# Patient Record
Sex: Female | Born: 1950 | Race: White | Hispanic: No | Marital: Married | Smoking: Never smoker
Health system: Southern US, Community
[De-identification: ages and names within clinical notes are randomized; demographics above are authoritative.]

---

## 2015-10-04 ENCOUNTER — Emergency Department
Admission: EM | Admit: 2015-10-04 | Discharge: 2015-10-04 | Disposition: A | Discharge status: Home / Self Care | Payer: PPO | Attending: Emergency Medicine | Admitting: Emergency Medicine

## 2015-10-04 DIAGNOSIS — I10 Essential (primary) hypertension: Secondary | ICD-10-CM | POA: Diagnosis not present

## 2015-10-04 DIAGNOSIS — Z7982 Long term (current) use of aspirin: Secondary | ICD-10-CM | POA: Insufficient documentation

## 2015-10-04 LAB — CBC
HCT: 44.5 % (ref 35.0–47.0)
HEMOGLOBIN: 15.1 g/dL (ref 12.0–16.0)
MCH: 29.4 pg (ref 26.0–34.0)
MCHC: 33.9 g/dL (ref 32.0–36.0)
MCV: 86.8 fL (ref 80.0–100.0)
Platelets: 329 10*3/uL (ref 150–440)
RBC: 5.12 MIL/uL (ref 3.80–5.20)
RDW: 12.4 % (ref 11.5–14.5)
WBC: 7.7 10*3/uL (ref 3.6–11.0)

## 2015-10-04 LAB — COMPREHENSIVE METABOLIC PANEL
ALK PHOS: 90 U/L (ref 38–126)
ALT: 31 U/L (ref 14–54)
ANION GAP: 7 (ref 5–15)
AST: 28 U/L (ref 15–41)
Albumin: 4.9 g/dL (ref 3.5–5.0)
BUN: 10 mg/dL (ref 6–20)
CALCIUM: 9.5 mg/dL (ref 8.9–10.3)
CO2: 28 mmol/L (ref 22–32)
Chloride: 107 mmol/L (ref 101–111)
Creatinine, Ser: 0.68 mg/dL (ref 0.44–1.00)
Glucose, Bld: 120 mg/dL — ABNORMAL HIGH (ref 65–99)
Potassium: 4.3 mmol/L (ref 3.5–5.1)
SODIUM: 142 mmol/L (ref 135–145)
Total Bilirubin: 0.8 mg/dL (ref 0.3–1.2)
Total Protein: 8 g/dL (ref 6.5–8.1)

## 2015-10-04 LAB — TROPONIN I

## 2015-10-04 MED ORDER — HYDROCHLOROTHIAZIDE 25 MG PO TABS
25.0000 mg | ORAL_TABLET | Freq: Every day | ORAL | Status: DC
  Administered 2015-10-04: 25 mg via ORAL
  Filled 2015-10-04: qty 1

## 2015-10-04 MED ORDER — HYDROCHLOROTHIAZIDE 25 MG PO TABS: 25.0000 mg | ORAL_TABLET | Freq: Every day | ORAL | Status: AC

## 2015-10-04 NOTE — ED Notes (Signed)
MD at bedside. 

## 2015-10-04 NOTE — Discharge Instructions (Signed)
As we discussed, though you do have high blood pressure (hypertension), fortunately it is not immediately dangerous at this time and does not need emergency intervention or admission to the hospital.  We have prescribed a good starting medication called HCTZ.  Please take it once a day as instructed.  Remember that it can lower your potassium so it is important to eat potassium rich foods and follow up with a primary care doctor for reevaluation at the next available opportunity.  Please follow up in clinic as recommended in these papers.    Return to the Emergency Department (ED) if you experience any worsening chest pain/pressure/tightness, difficulty breathing, or sudden sweating, or other symptoms that concern you.   Hypertension Hypertension, commonly called high blood pressure, is when the force of blood pumping through your arteries is too strong. Your arteries are the blood vessels that carry blood from your heart throughout your body. A blood pressure reading consists of a higher number over a lower number, such as 110/72. The higher number (systolic) is the pressure inside your arteries when your heart pumps. The lower number (diastolic) is the pressure inside your arteries when your heart relaxes. Ideally you want your blood pressure below 120/80. Hypertension forces your heart to work harder to pump blood. Your arteries may become narrow or stiff. Having hypertension puts you at risk for heart disease, stroke, and other problems.  RISK FACTORS Some risk factors for high blood pressure are controllable. Others are not.  Risk factors you cannot control include:   Race. You may be at higher risk if you are African American.  Age. Risk increases with age.  Gender. Men are at higher risk than women before age 75 years. After age 68, women are at higher risk than men. Risk factors you can control include:  Not getting enough exercise or physical activity.  Being overweight.  Getting too  much fat, sugar, calories, or salt in your diet.  Drinking too much alcohol. SIGNS AND SYMPTOMS Hypertension does not usually cause signs or symptoms. Extremely high blood pressure (hypertensive crisis) may cause headache, anxiety, shortness of breath, and nosebleed. DIAGNOSIS  To check if you have hypertension, your health care provider will measure your blood pressure while you are seated, with your arm held at the level of your heart. It should be measured at least twice using the same arm. Certain conditions can cause a difference in blood pressure between your right and left arms. A blood pressure reading that is higher than normal on one occasion does not mean that you need treatment. If one blood pressure reading is high, ask your health care provider about having it checked again. TREATMENT  Treating high blood pressure includes making lifestyle changes and possibly taking medicine. Living a healthy lifestyle can help lower high blood pressure. You may need to change some of your habits. Lifestyle changes may include:  Following the DASH diet. This diet is high in fruits, vegetables, and whole grains. It is low in salt, red meat, and added sugars.  Getting at least 2 hours of brisk physical activity every week.  Losing weight if necessary.  Not smoking.  Limiting alcoholic beverages.  Learning ways to reduce stress. If lifestyle changes are not enough to get your blood pressure under control, your health care provider may prescribe medicine. You may need to take more than one. Work closely with your health care provider to understand the risks and benefits. HOME CARE INSTRUCTIONS  Have your blood pressure rechecked  as directed by your health care provider.   Take medicines only as directed by your health care provider. Follow the directions carefully. Blood pressure medicines must be taken as prescribed. The medicine does not work as well when you skip doses. Skipping doses also  puts you at risk for problems.   Do not smoke.   Monitor your blood pressure at home as directed by your health care provider. SEEK MEDICAL CARE IF:   You think you are having a reaction to medicines taken.  You have recurrent headaches or feel dizzy.  You have swelling in your ankles.  You have trouble with your vision. SEEK IMMEDIATE MEDICAL CARE IF:  You develop a severe headache or confusion.  You have unusual weakness, numbness, or feel faint.  You have severe chest or abdominal pain.  You vomit repeatedly.  You have trouble breathing. MAKE SURE YOU:   Understand these instructions.  Will watch your condition.  Will get help right away if you are not doing well or get worse. Document Released: 09/23/2005 Document Revised: 02/07/2014 Document Reviewed: 07/16/2013 [facility] Patient Information 2015 [place], [place]. This information is not intended to replace advice given to you by your health care provider. Make sure you discuss any questions you have with your health care provider.  How to Take Your Blood Pressure HOW DO I GET A BLOOD PRESSURE MACHINE?  You can buy an electronic home blood pressure machine at your local pharmacy. Insurance will sometimes cover the cost if you have a prescription.  Ask your doctor what type of machine is best for you. There are different machines for your arm and your wrist.  If you decide to buy a machine to check your blood pressure on your arm, first check the size of your arm so you can buy the right size cuff. To check the size of your arm:   Use a measuring tape that shows both inches and centimeters.   Wrap the measuring tape around the upper-middle part of your arm. You may need someone to help you measure.   Write down your arm measurement in both inches and centimeters.   To measure your blood pressure correctly, it is important to have the right size cuff.   If your arm is up to 13 inches (up to 34  centimeters), get an adult cuff size.  If your arm is 13 to 17 inches (35 to 44 centimeters), get a large adult cuff size.    If your arm is 17 to 20 inches (45 to 52 centimeters), get an adult thigh cuff.  WHAT DO THE NUMBERS MEAN?   There are two numbers that make up your blood pressure. For example: 120/80.  The first number (120 in our example) is called the "systolic pressure." It is a measure of the pressure in your blood vessels when your heart is pumping blood.  The second number (80 in our example) is called the "diastolic pressure." It is a measure of the pressure in your blood vessels when your heart is resting between beats.  Your doctor will tell you what your blood pressure should be. WHAT SHOULD I DO BEFORE I CHECK MY BLOOD PRESSURE?   Try to rest or relax for at least 30 minutes before you check your blood pressure.  Do not smoke.  Do not have any drinks with caffeine, such as:  Soda.  Coffee.  Tea.  Check your blood pressure in a quiet room.  Sit down and stretch out your  arm on a table. Keep your arm at about the level of your heart. Let your arm relax.  Make sure that your legs are not crossed. HOW DO I CHECK MY BLOOD PRESSURE?  Follow the directions that came with your machine.  Make sure you remove any tight-fighting clothing from your arm or wrist. Wrap the cuff around your upper arm or wrist. You should be able to fit a finger between the cuff and your arm. If you cannot fit a finger between the cuff and your arm, it is too tight and should be removed and rewrapped.  Some units require you to manually pump up the arm cuff.  Automatic units inflate the cuff when you press a button.  Cuff deflation is automatic in both models.  After the cuff is inflated, the unit measures your blood pressure and pulse. The readings are shown on a monitor. Hold still and breathe normally while the cuff is inflated.  Getting a reading takes less than a  minute.  Some models store readings in a memory. Some provide a printout of readings. If your machine does not store your readings, keep a written record.  Take readings with you to your next visit with your doctor. Document Released: 09/05/2008 Document Revised: 02/07/2014 Document Reviewed: 11/18/2013 [facility] Patient Information 2015 [place]. This information is not intended to replace advice given to you by your health care provider. Make sure you discuss any questions you have with your health care provider.    DASH Eating Plan DASH stands for "Dietary Approaches to Stop Hypertension." The DASH eating plan is a healthy eating plan that has been shown to reduce high blood pressure (hypertension). Additional health benefits may include reducing the risk of type 2 diabetes mellitus, heart disease, and stroke. The DASH eating plan may also help with weight loss. WHAT DO I NEED TO KNOW ABOUT THE DASH EATING PLAN? For the DASH eating plan, you will follow these general guidelines:  Choose foods with a percent daily value for sodium of less than 5% (as listed on the food label).  Use salt-free seasonings or herbs instead of table salt or sea salt.  Check with your health care provider or pharmacist before using salt substitutes.  Eat lower-sodium products, often labeled as "lower sodium" or "no salt added."  Eat fresh foods.  Eat more vegetables, fruits, and low-fat dairy products.  Choose whole grains. Look for the word "whole" as the first word in the ingredient list.  Choose fish and skinless chicken or [place] more often than red meat. Limit fish, poultry, and meat to 6 oz (170 g) each day.  Limit sweets, desserts, sugars, and sugary drinks.  Choose heart-healthy fats.  Limit cheese to 1 oz (28 g) per day.  Eat more home-cooked food and less restaurant, buffet, and fast food.  Limit fried foods.  Cook foods using methods other than frying.  Limit canned vegetables.  If you do use them, rinse them well to decrease the sodium.  When eating at a restaurant, ask that your food be prepared with less salt, or no salt if possible. WHAT FOODS CAN I EAT? Seek help from a dietitian for individual calorie needs. Grains Whole grain or whole wheat bread. Brown rice. Whole grain or whole wheat pasta. Quinoa, bulgur, and whole grain cereals. Low-sodium cereals. Corn or whole wheat flour tortillas. Whole grain cornbread. Whole grain crackers. Low-sodium crackers. Vegetables Fresh or frozen vegetables (raw, steamed, roasted, or grilled). Low-sodium or reduced-sodium tomato and  vegetable juices. Low-sodium or reduced-sodium tomato sauce and paste. Low-sodium or reduced-sodium canned vegetables.  Fruits All fresh, canned (in natural juice), or frozen fruits. Meat and Other Protein Products Ground beef (85% or leaner), grass-fed beef, or beef trimmed of fat. Skinless chicken or [place]. Ground chicken or [place]. Pork trimmed of fat. All fish and seafood. Eggs. Dried beans, peas, or lentils. Unsalted nuts and seeds. Unsalted canned beans. Dairy Low-fat dairy products, such as skim or 1% milk, 2% or reduced-fat cheeses, low-fat ricotta or cottage cheese, or plain low-fat yogurt. Low-sodium or reduced-sodium cheeses. Fats and Oils Tub margarines without trans fats. Light or reduced-fat mayonnaise and salad dressings (reduced sodium). Avocado. Safflower, olive, or canola oils. Natural peanut or almond butter. Other Unsalted popcorn and pretzels. The items listed above may not be a complete list of recommended foods or beverages. Contact your dietitian for more options. WHAT FOODS ARE NOT RECOMMENDED? Grains White bread. White pasta. White rice. Refined cornbread. Bagels and croissants. Crackers that contain trans fat. Vegetables Creamed or fried vegetables. Vegetables in a cheese sauce. Regular canned vegetables. Regular canned tomato sauce and paste. Regular tomato and  vegetable juices. Fruits Dried fruits. Canned fruit in light or heavy syrup. Fruit juice. Meat and Other Protein Products Fatty cuts of meat. Ribs, chicken wings, bacon, sausage, bologna, salami, chitterlings, fatback, hot dogs, bratwurst, and packaged luncheon meats. Salted nuts and seeds. Canned beans with salt. Dairy Whole or 2% milk, cream, half-and-half, and cream cheese. Whole-fat or sweetened yogurt. Full-fat cheeses or blue cheese. Nondairy creamers and whipped toppings. Processed cheese, cheese spreads, or cheese curds. Condiments Onion and garlic salt, seasoned salt, table salt, and sea salt. Canned and packaged gravies. Worcestershire sauce. Tartar sauce. Barbecue sauce. Teriyaki sauce. Soy sauce, including reduced sodium. Steak sauce. Fish sauce. Oyster sauce. Cocktail sauce. Horseradish. Ketchup and mustard. Meat flavorings and tenderizers. Bouillon cubes. Hot sauce. Tabasco sauce. Marinades. Taco seasonings. Relishes. Fats and Oils Butter, stick margarine, lard, shortening, ghee, and bacon fat. Coconut, palm kernel, or palm oils. Regular salad dressings. Other Pickles and olives. Salted popcorn and pretzels. The items listed above may not be a complete list of foods and beverages to avoid. Contact your dietitian for more information. WHERE CAN I FIND MORE INFORMATION? National Heart, Lung, and Blood Institute: [employer]   This information is not intended to replace advice given to you by your health care provider. Make sure you discuss any questions you have with your health care provider.   Document Released: 09/12/2011 Document Revised: 10/14/2014 Document Reviewed: 07/28/2013 Elsevier Interactive Patient Education 2016 [employer].   How to Take Your Blood Pressure HOW DO I GET A BLOOD PRESSURE MACHINE?  You can buy an electronic home blood pressure machine at your local pharmacy. Insurance will sometimes cover the cost if you  have a prescription.  Ask your doctor what type of machine is best for you. There are different machines for your arm and your wrist.  If you decide to buy a machine to check your blood pressure on your arm, first check the size of your arm so you can buy the right size cuff. To check the size of your arm:   Use a measuring tape that shows both inches and centimeters.   Wrap the measuring tape around the upper-middle part of your arm. You may need someone to help you measure.   Write down your arm measurement in both inches and centimeters.   To measure your blood pressure correctly, it is  important to have the right size cuff.   If your arm is up to 13 inches (up to 34 centimeters), get an adult cuff size.  If your arm is 13 to 17 inches (35 to 44 centimeters), get a large adult cuff size.    If your arm is 17 to 20 inches (45 to 52 centimeters), get an adult thigh cuff.  WHAT DO THE NUMBERS MEAN?   There are two numbers that make up your blood pressure. For example: 120/80.  The first number (120 in our example) is called the "systolic pressure." It is a measure of the pressure in your blood vessels when your heart is pumping blood.  The second number (80 in our example) is called the "diastolic pressure." It is a measure of the pressure in your blood vessels when your heart is resting between beats.  Your doctor will tell you what your blood pressure should be. WHAT SHOULD I DO BEFORE I CHECK MY BLOOD PRESSURE?   Try to rest or relax for at least 30 minutes before you check your blood pressure.  Do not smoke.  Do not have any drinks with caffeine, such as:  Soda.  Coffee.  Tea.  Check your blood pressure in a quiet room.  Sit down and stretch out your arm on a table. Keep your arm at about the level of your heart. Let your arm relax.  Make sure that your legs are not crossed. HOW DO I CHECK MY BLOOD PRESSURE?  Follow the directions that came with your  machine.  Make sure you remove any tight-fitting clothing from your arm or wrist. Wrap the cuff around your upper arm or wrist. You should be able to fit a finger between the cuff and your arm. If you cannot fit a finger between the cuff and your arm, it is too tight and should be removed and rewrapped.  Some units require you to manually pump up the arm cuff.  Automatic units inflate the cuff when you press a button.  Cuff deflation is automatic in both models.  After the cuff is inflated, the unit measures your blood pressure and pulse. The readings are shown on a monitor. Hold still and breathe normally while the cuff is inflated.  Getting a reading takes less than a minute.  Some models store readings in a memory. Some provide a printout of readings. If your machine does not store your readings, keep a written record.  Take readings with you to your next visit with your doctor.   This information is not intended to replace advice given to you by your health care provider. Make sure you discuss any questions you have with your health care provider.   Document Released: 09/05/2008 Document Revised: 10/14/2014 Document Reviewed: 11/18/2013 Elsevier Interactive Patient Education 2016 Elsevier Inc.  Managing Your High Blood Pressure Blood pressure is a measurement of how forceful your blood is pressing against the walls of the arteries. Arteries are muscular tubes within the circulatory system. Blood pressure does not stay the same. Blood pressure rises when you are active, excited, or nervous; and it lowers during sleep and relaxation. If the numbers measuring your blood pressure stay above normal most of the time, you are at risk for health problems. High blood pressure (hypertension) is a long-term (chronic) condition in which blood pressure is elevated. A blood pressure reading is recorded as two numbers, such as 120 over 80 (or 120/80). The first, higher number is called the systolic  pressure. It is a measure of the pressure in your arteries as the heart beats. The second, lower number is called the diastolic pressure. It is a measure of the pressure in your arteries as the heart relaxes between beats.  Keeping your blood pressure in a normal range is important to your overall health and prevention of health problems, such as heart disease and stroke. When your blood pressure is uncontrolled, your heart has to work harder than normal. High blood pressure is a very common condition in adults because blood pressure tends to rise with age. Men and women are equally likely to have hypertension but at different times in life. Before age 65, men are more likely to have hypertension. After 66 years of age, women are more likely to have it. Hypertension is especially common in African Americans. This condition often has no signs or symptoms. The cause of the condition is usually not known. Your caregiver can help you come up with a plan to keep your blood pressure in a normal, healthy range. BLOOD PRESSURE STAGES Blood pressure is classified into four stages: normal, prehypertension, stage 1, and stage 2. Your blood pressure reading will be used to determine what type of treatment, if any, is necessary. Appropriate treatment options are tied to these four stages:  Normal  Systolic pressure (mm Hg): below 120.  Diastolic pressure (mm Hg): below 80. Prehypertension  Systolic pressure (mm Hg): 120 to 139.  Diastolic pressure (mm Hg): 80 to 89. Stage1  Systolic pressure (mm Hg): 140 to 159.  Diastolic pressure (mm Hg): 90 to 99. Stage2  Systolic pressure (mm Hg): 160 or above.  Diastolic pressure (mm Hg): 100 or above. RISKS RELATED TO HIGH BLOOD PRESSURE Managing your blood pressure is an important responsibility. Uncontrolled high blood pressure can lead to:  A heart attack.  A stroke.  A weakened blood vessel (aneurysm).  Heart failure.  Kidney damage.  Eye  damage.  Metabolic syndrome.  Memory and concentration problems. HOW TO MANAGE YOUR BLOOD PRESSURE Blood pressure can be managed effectively with lifestyle changes and medicines (if needed). Your caregiver will help you come up with a plan to bring your blood pressure within a normal range. Your plan should include the following: Education  Read all information provided by your caregivers about how to control blood pressure.  Educate yourself on the latest guidelines and treatment recommendations. New research is always being done to further define the risks and treatments for high blood pressure. Lifestylechanges  Control your weight.  Avoid smoking.  Stay physically active.  Reduce the amount of salt in your diet.  Reduce stress.  Control any chronic conditions, such as high cholesterol or diabetes.  Reduce your alcohol intake. Medicines  Several medicines (antihypertensive medicines) are available, if needed, to bring blood pressure within a normal range. Communication  Review all the medicines you take with your caregiver because there may be side effects or interactions.  Talk with your caregiver about your diet, exercise habits, and other lifestyle factors that may be contributing to high blood pressure.  See your caregiver regularly. Your caregiver can help you create and adjust your plan for managing high blood pressure. RECOMMENDATIONS FOR TREATMENT AND FOLLOW-UP  The following recommendations are based on current guidelines for managing high blood pressure in nonpregnant adults. Use these recommendations to identify the proper follow-up period or treatment option based on your blood pressure reading. You can discuss these options with your caregiver.  Systolic pressure of 120 to  139 or diastolic pressure of 80 to 89: Follow up with your caregiver as directed.  Systolic pressure of 140 to 160 or diastolic pressure of 90 to 100: Follow up with your caregiver within  2 months.  Systolic pressure above 160 or diastolic pressure above 100: Follow up with your caregiver within 1 month.  Systolic pressure above 180 or diastolic pressure above 110: Consider antihypertensive therapy; follow up with your caregiver within 1 week.  Systolic pressure above 200 or diastolic pressure above 120: Begin antihypertensive therapy; follow up with your caregiver within 1 week.   This information is not intended to replace advice given to you by your health care provider. Make sure you discuss any questions you have with your health care provider.   Document Released: 06/17/2012 Document Reviewed: 06/17/2012 Elsevier Interactive Patient Education 2016 Elsevier Inc.   Potassium Content of Foods  Potassium is a mineral found in many foods and drinks. It helps keep fluids and minerals balanced in your body and affects how steadily your heart beats. Potassium also helps control your blood pressure and keep your muscles and nervous system healthy.  Certain health conditions and medicines may change the balance of potassium in your body. When this happens, you can help balance your level of potassium through the foods that you do or do not eat. Your health care provider or dietitian may recommend an amount of potassium that you should have each day. The following lists of foods provide the amount of potassium (in parentheses) per serving in each item.  HIGH IN POTASSIUM  The following foods and beverages have 200 mg or more of potassium per serving:  Apricots, 2 raw or 5 dry (200 mg).  Artichoke, 1 medium (345 mg).  Avocado, raw,  each (245 mg).  Banana, 1 medium (425 mg).  Beans, lima, or baked beans, canned,  cup (280 mg).  Beans, white, canned,  cup (595 mg).  Beef roast, 3 oz (320 mg).  Beef, ground, 3 oz (270 mg).  Beets, raw or cooked,  cup (260 mg).  Bran muffin, 2 oz (300 mg).  Broccoli,  cup (230 mg).  Brussels sprouts,  cup (250 mg).  Cantaloupe,  cup (215  mg).  Cereal, 100% bran,  cup (200-400 mg).  Cheeseburger, single, fast food, 1 each (225-400 mg).  Chicken, 3 oz (220 mg).  Clams, canned, 3 oz (535 mg).  Crab, 3 oz (225 mg).  Dates, 5 each (270 mg).  Dried beans and peas,  cup (300-475 mg).  Figs, dried, 2 each (260 mg).  Fish: halibut, tuna, cod, snapper, 3 oz (480 mg).  Fish: salmon, haddock, swordfish, perch, 3 oz (300 mg).  Fish, tuna, canned 3 oz (200 mg).  [place] fries, fast food, 3 oz (470 mg).  Granola with fruit and nuts,  cup (200 mg).  Grapefruit juice,  cup (200 mg).  Greens, beet,  cup (655 mg).  Honeydew melon,  cup (200 mg).  Kale, raw, 1 cup (300 mg).  Kiwi, 1 medium (240 mg).  Kohlrabi, rutabaga, parsnips,  cup (280 mg).  Lentils,  cup (365 mg).  Mango, 1 each (325 mg).  Milk, chocolate, 1 cup (420 mg).  Milk: nonfat, low-fat, whole, buttermilk, 1 cup (350-380 mg).  Molasses, 1 Tbsp (295 mg).  Mushrooms,  cup (280) mg.  Nectarine, 1 each (275 mg).  Nuts: almonds, peanuts, hazelnuts, [place], cashew, mixed, 1 oz (200 mg).  Nuts, pistachios, 1 oz (295 mg).  Orange, 1 each (240 mg).  Orange juice,  cup (235 mg).  Papaya, medium,  fruit (390 mg).  Peanut butter, chunky, 2 Tbsp (240 mg).  Peanut butter, smooth, 2 Tbsp (210 mg).  Pear, 1 medium (200 mg).  Pomegranate, 1 whole (400 mg).  Pomegranate juice,  cup (215 mg).  Pork, 3 oz (350 mg).  Potato chips, salted, 1 oz (465 mg).  Potato, baked with skin, 1 medium (925 mg).  Potatoes, boiled,  cup (255 mg).  Potatoes, mashed,  cup (330 mg).  Prune juice,  cup (370 mg).  Prunes, 5 each (305 mg).  Pudding, chocolate,  cup (230 mg).  Pumpkin, canned,  cup (250 mg).  Raisins, seedless,  cup (270 mg).  Seeds, sunflower or pumpkin, 1 oz (240 mg).  Soy milk, 1 cup (300 mg).  Spinach,  cup (420 mg).  Spinach, canned,  cup (370 mg).  Sweet potato, baked with skin, 1 medium (450 mg).  Swiss chard,  cup (480 mg).  Tomato or vegetable juice,   cup (275 mg).  Tomato sauce or puree,  cup (400-550 mg).  Tomato, raw, 1 medium (290 mg).  Tomatoes, canned,  cup (200-300 mg).  [place], 3 oz (250 mg).  Wheat germ, 1 oz (250 mg).  Winter squash,  cup (250 mg).  Yogurt, plain or fruited, 6 oz (260-435 mg).  Zucchini,  cup (220 mg). MODERATE IN POTASSIUM  The following foods and beverages have 50-200 mg of potassium per serving:  Apple, 1 each (150 mg).  Apple juice,  cup (150 mg).  Applesauce,  cup (90 mg).  Apricot nectar,  cup (140 mg).  Asparagus, small spears,  cup or 6 spears (155 mg).  Bagel, cinnamon raisin, 1 each (130 mg).  Bagel, egg or plain, 4 in., 1 each (70 mg).  Beans, green,  cup (90 mg).  Beans, yellow,  cup (190 mg).  Beer, regular, 12 oz (100 mg).  Beets, canned,  cup (125 mg).  Blackberries,  cup (115 mg).  Blueberries,  cup (60 mg).  Bread, whole wheat, 1 slice (70 mg).  Broccoli, raw,  cup (145 mg).  Cabbage,  cup (150 mg).  Carrots, cooked or raw,  cup (180 mg).  Cauliflower, raw,  cup (150 mg).  Celery, raw,  cup (155 mg).  Cereal, bran flakes, cup (120-150 mg).  Cheese, cottage,  cup (110 mg).  Cherries, 10 each (150 mg).  Chocolate, 1 oz bar (165 mg).  Coffee, brewed 6 oz (90 mg).  Corn,  cup or 1 ear (195 mg).  Cucumbers,  cup (80 mg).  Egg, large, 1 each (60 mg).  Eggplant,  cup (60 mg).  Endive, raw, cup (80 mg).  English muffin, 1 each (65 mg).  Fish, orange roughy, 3 oz (150 mg).  Frankfurter, beef or pork, 1 each (75 mg).  Fruit cocktail,  cup (115 mg).  Grape juice,  cup (170 mg).  Grapefruit,  fruit (175 mg).  Grapes,  cup (155 mg).  Greens: kale, turnip, collard,  cup (110-150 mg).  Ice cream or frozen yogurt, chocolate,  cup (175 mg).  Ice cream or frozen yogurt, vanilla,  cup (120-150 mg).  Lemons, limes, 1 each (80 mg).  Lettuce, all types, 1 cup (100 mg).  Mixed vegetables,  cup (150 mg).  Mushrooms, raw,  cup (110 mg).  Nuts: walnuts,  pecans, or macadamia, 1 oz (125 mg).  Oatmeal,  cup (80 mg).  Okra,  cup (110 mg).  Onions, raw,  cup (120 mg).  Peach, 1  each (185 mg).  Peaches, canned,  cup (120 mg).  Pears, canned,  cup (120 mg).  Peas, green, frozen,  cup (90 mg).  Peppers, green,  cup (130 mg).  Peppers, red,  cup (160 mg).  Pineapple juice,  cup (165 mg).  Pineapple, fresh or canned,  cup (100 mg).  Plums, 1 each (105 mg).  Pudding, vanilla,  cup (150 mg).  Raspberries,  cup (90 mg).  Rhubarb,  cup (115 mg).  Rice, wild,  cup (80 mg).  Shrimp, 3 oz (155 mg).  Spinach, raw, 1 cup (170 mg).  Strawberries,  cup (125 mg).  Summer squash  cup (175-200 mg).  Swiss chard, raw, 1 cup (135 mg).  Tangerines, 1 each (140 mg).  Tea, brewed, 6 oz (65 mg).  Turnips,  cup (140 mg).  Watermelon,  cup (85 mg).  Wine, red, table, 5 oz (180 mg).  Wine, white, table, 5 oz (100 mg). LOW IN POTASSIUM  The following foods and beverages have less than 50 mg of potassium per serving.  Bread, white, 1 slice (30 mg).  Carbonated beverages, 12 oz (less than 5 mg).  Cheese, 1 oz (20-30 mg).  Cranberries,  cup (45 mg).  Cranberry juice cocktail,  cup (20 mg).  Fats and oils, 1 Tbsp (less than 5 mg).  Hummus, 1 Tbsp (32 mg).  Nectar: papaya, mango, or pear,  cup (35 mg).  Rice, white or brown,  cup (50 mg).  Spaghetti or macaroni,  cup cooked (30 mg).  Tortilla, flour or corn, 1 each (50 mg).  Waffle, 4 in., 1 each (50 mg).  Water chestnuts,  cup (40 mg). This information is not intended to replace advice given to you by your health care provider. Make sure you discuss any questions you have with your health care provider.  Document Released: 05/07/2005 Document Revised: 09/28/2013 Document Reviewed: 08/20/2013  Elsevier Interactive Patient Education [employer]

## 2015-10-04 NOTE — ED Provider Notes (Signed)
[facility] Emergency Department Provider Note  ____________________________________________  Time seen: Approximately 4:19 PM  I have reviewed the triage vital signs and the nursing notes.   HISTORY  Chief Complaint Hypertension    HPI Samantha Baldwin is a 64 y.o. female with no past medical history and who takes no medications and who has no primary care doctor who presents with asymptomatic hypertension after having her blood pressure taken at a health fair.  She has not had a physical exam or a doctor checkup and more than 2 years.  She reports that she currently takes no medications daily and she feels fine. The only reason she came in is that she went to a health fair today and had her blood pressure checked. She reports that her blood pressure was in the 180s over 100 range, and she was told that she should go immediately to the hospital and that if she starts to feel ill they should pull over and call EMS. As a result she is quite nervous and concerned but remains asymptomatic. Specifically she denies any history of chest pain, shortness of breath, nausea, vomiting, diarrhea, headache, visual changes, or urinary changes.   History reviewed. No pertinent past medical history.  There are no active problems to display for this patient.   History reviewed. No pertinent past surgical history.  Current Outpatient Rx  Name  Route  Sig  Dispense  Refill  . aspirin EC 81 MG tablet   Oral   Take 81 mg by mouth every evening.         . hydrochlorothiazide (HYDRODIURIL) 25 MG tablet   Oral   Take 1 tablet (25 mg total) by mouth daily.   30 tablet   1     Allergies Review of patient's allergies indicates no known allergies.  No family history on file.  Social History Social History  Substance Use Topics  . Smoking status: Never Smoker   . Smokeless tobacco: None  . Alcohol Use: No    Review of Systems Constitutional: No fever/chills Eyes:  No visual changes. ENT: No sore throat. Cardiovascular: Denies chest pain. Respiratory: Denies shortness of breath. Gastrointestinal: No abdominal pain.  No nausea, no vomiting.  No diarrhea.  No constipation. Genitourinary: Negative for dysuria. Musculoskeletal: Negative for back pain. Skin: Negative for rash. Neurological: Negative for headaches, focal weakness or numbness.  10-point ROS otherwise negative.  ____________________________________________   PHYSICAL EXAM:  VITAL SIGNS: ED Triage Vitals  Enc Vitals Group     BP 10/04/15 1340 226/99 mmHg     Pulse Rate 10/04/15 1340 71     Resp 10/04/15 1340 20     Temp 10/04/15 1340 98.2 F (36.8 C)     Temp Source 10/04/15 1340 Oral     SpO2 10/04/15 1340 98 %     Weight 10/04/15 1340 163 lb (73.936 kg)     Height 10/04/15 1340 5\' 2"  (1.575 m)     Head Cir --      Peak Flow --      Pain Score --      Pain Loc --      Pain Edu? --      Excl. in GC? --     Constitutional: Alert and oriented. Well appearing and in no acute distress. Eyes: Conjunctivae are normal. PERRL. EOMI. no evidence of papilledema on funduscopic exam. Head: Atraumatic. Nose: No congestion/rhinnorhea. Mouth/Throat: Mucous membranes are moist.  Oropharynx non-erythematous. Neck: No stridor.  Cardiovascular: Normal rate, regular rhythm. Grossly normal heart sounds.  Good peripheral circulation. Respiratory: Normal respiratory effort.  No retractions. Lungs CTAB. Gastrointestinal: Soft and nontender. No distention. No abdominal bruits. No CVA tenderness. Musculoskeletal: No lower extremity tenderness nor edema.  No joint effusions. Neurologic:  Normal speech and language. No gross focal neurologic deficits are appreciated.  Skin:  Skin is warm, dry and intact. No rash noted. Psychiatric: Mood and affect are normal. Speech and behavior are normal.  ____________________________________________   LABS (all labs ordered are listed, but only abnormal  results are displayed)  Labs Reviewed  COMPREHENSIVE METABOLIC PANEL - Abnormal; Notable for the following:    Glucose, Bld 120 (*)    All other components within normal limits  CBC  TROPONIN I   ____________________________________________  EKG  ED ECG REPORT I, Isabella Ida, the attending physician, personally viewed and interpreted this ECG.   Date: 10/04/2015  EKG Time: 13:47  Rate: 68  Rhythm: normal sinus rhythm  Axis: Left axis deviation  Intervals:right bundle branch block  ST&T Change: Inverted T-wave in lead 3 and V2 through V4, nonspecific and not accompanied with any chest pain or shortness of breath.  ____________________________________________  RADIOLOGY   No results found.  ____________________________________________   PROCEDURES  Procedure(s) performed: None  Critical Care performed: No ____________________________________________   INITIAL IMPRESSION / ASSESSMENT AND PLAN / ED COURSE  Pertinent labs & imaging results that were available during my care of the patient were reviewed by me and considered in my medical decision making (see chart for details).  Though the patients blood pressure is consistently elevated throughout her stay in the emergency department, she remains asymptomatic. I gave her a dose of HCTZ 25 mg PO and it had minimal effect. However, the patient's blood pressure has certainly been elevated for an extended period of time, and it would be irresponsible and dangerous of me to attempt to quickly or dramatically lower her blood pressure, particularly with IV medication, given that she is completely asymptomatic with no evidence of end organ dysfunction in a reassuring workup.  I discussed this extensively with her and her husband. We all agreed that the best course of action is to start on a medication such as HCTZ 25 mg PO daily and for her to become established with the primary care doctor as soon as possible. My understanding  is that Dr. Aram Beecham is accepting new patients and I provided his name and phone number, but encouraged her to follow up with anyone whom she can see relatively quickly. I warned her that HCTZ can cause potassium depletion and encouraged her to eat plenty of testing rich foods as well as following up for repeat blood tests within the next week to two weeks to check her level. I gave my usual and customary return precautions. She and her husband understand and agree completely with the plan.  ____________________________________________  FINAL CLINICAL IMPRESSION(S) / ED DIAGNOSES  Final diagnoses:  Essential hypertension      NEW MEDICATIONS STARTED DURING THIS VISIT:  Discharge Medication List as of 10/04/2015  4:36 PM    START taking these medications   Details  hydrochlorothiazide (HYDRODIURIL) 25 MG tablet Take 1 tablet (25 mg total) by mouth daily., Starting 10/04/2015, Until Discontinued, Print         Loleta Rose, MD 10/05/15 [phone]

## 2015-10-04 NOTE — ED Notes (Signed)
MD notified of pt, no further order received.

## 2015-10-04 NOTE — ED Notes (Signed)
Pt arrives to ED via POV for hypertension. Pt went to health fair today and had BP read high at 186/108, no hx of hypertension. Pt alert and oriented X4, active, cooperative, pt in NAD. RR even and unlabored, color WNL.  Denies headache, weakness or any other symptoms. Pt states that she feels normal.

## 2015-10-11 DIAGNOSIS — Z1211 Encounter for screening for malignant neoplasm of colon: Secondary | ICD-10-CM | POA: Diagnosis not present

## 2015-10-11 DIAGNOSIS — Z1239 Encounter for other screening for malignant neoplasm of breast: Secondary | ICD-10-CM | POA: Diagnosis not present

## 2015-10-11 DIAGNOSIS — Z0000 Encounter for general adult medical examination without abnormal findings: Secondary | ICD-10-CM | POA: Diagnosis not present

## 2015-10-11 DIAGNOSIS — Z1329 Encounter for screening for other suspected endocrine disorder: Secondary | ICD-10-CM | POA: Diagnosis not present

## 2015-10-11 DIAGNOSIS — Z79899 Other long term (current) drug therapy: Secondary | ICD-10-CM | POA: Diagnosis not present

## 2015-10-11 DIAGNOSIS — Z13220 Encounter for screening for lipoid disorders: Secondary | ICD-10-CM | POA: Diagnosis not present

## 2015-10-11 DIAGNOSIS — I10 Essential (primary) hypertension: Secondary | ICD-10-CM | POA: Diagnosis not present

## 2015-10-12 ENCOUNTER — Other Ambulatory Visit: Payer: Self-pay | Admitting: Internal Medicine

## 2015-10-12 DIAGNOSIS — Z1231 Encounter for screening mammogram for malignant neoplasm of breast: Secondary | ICD-10-CM

## 2015-10-17 ENCOUNTER — Ambulatory Visit
Admission: RE | Admit: 2015-10-17 | Discharge: 2015-10-17 | Disposition: A | Discharge status: Home / Self Care | Payer: PPO | Source: Ambulatory Visit | Attending: Internal Medicine | Admitting: Internal Medicine

## 2015-10-17 DIAGNOSIS — Z1231 Encounter for screening mammogram for malignant neoplasm of breast: Secondary | ICD-10-CM | POA: Diagnosis not present

## 2015-10-20 DIAGNOSIS — I10 Essential (primary) hypertension: Secondary | ICD-10-CM | POA: Diagnosis not present

## 2015-11-15 DIAGNOSIS — Z13220 Encounter for screening for lipoid disorders: Secondary | ICD-10-CM | POA: Diagnosis not present

## 2015-11-15 DIAGNOSIS — Z79899 Other long term (current) drug therapy: Secondary | ICD-10-CM | POA: Diagnosis not present

## 2015-11-15 DIAGNOSIS — I10 Essential (primary) hypertension: Secondary | ICD-10-CM | POA: Diagnosis not present

## 2015-11-15 DIAGNOSIS — Z1329 Encounter for screening for other suspected endocrine disorder: Secondary | ICD-10-CM | POA: Diagnosis not present

## 2015-12-06 DIAGNOSIS — Z79899 Other long term (current) drug therapy: Secondary | ICD-10-CM | POA: Diagnosis not present

## 2015-12-06 DIAGNOSIS — Z1329 Encounter for screening for other suspected endocrine disorder: Secondary | ICD-10-CM | POA: Diagnosis not present

## 2015-12-06 DIAGNOSIS — I10 Essential (primary) hypertension: Secondary | ICD-10-CM | POA: Diagnosis not present

## 2015-12-06 DIAGNOSIS — Z13220 Encounter for screening for lipoid disorders: Secondary | ICD-10-CM | POA: Diagnosis not present

## 2015-12-13 DIAGNOSIS — R7309 Other abnormal glucose: Secondary | ICD-10-CM | POA: Diagnosis not present

## 2015-12-13 DIAGNOSIS — H6123 Impacted cerumen, bilateral: Secondary | ICD-10-CM | POA: Diagnosis not present

## 2015-12-13 DIAGNOSIS — I10 Essential (primary) hypertension: Secondary | ICD-10-CM | POA: Diagnosis not present

## 2015-12-29 DIAGNOSIS — H6123 Impacted cerumen, bilateral: Secondary | ICD-10-CM | POA: Diagnosis not present

## 2016-03-28 DIAGNOSIS — Z79899 Other long term (current) drug therapy: Secondary | ICD-10-CM | POA: Diagnosis not present

## 2016-03-28 DIAGNOSIS — I10 Essential (primary) hypertension: Secondary | ICD-10-CM | POA: Diagnosis not present

## 2016-03-28 DIAGNOSIS — R7309 Other abnormal glucose: Secondary | ICD-10-CM | POA: Diagnosis not present

## 2016-10-15 DIAGNOSIS — Z1231 Encounter for screening mammogram for malignant neoplasm of breast: Secondary | ICD-10-CM | POA: Diagnosis not present

## 2016-10-15 DIAGNOSIS — Z79899 Other long term (current) drug therapy: Secondary | ICD-10-CM | POA: Diagnosis not present

## 2016-10-15 DIAGNOSIS — I10 Essential (primary) hypertension: Secondary | ICD-10-CM | POA: Diagnosis not present

## 2016-10-15 DIAGNOSIS — Z0000 Encounter for general adult medical examination without abnormal findings: Secondary | ICD-10-CM | POA: Diagnosis not present

## 2016-10-15 DIAGNOSIS — R7309 Other abnormal glucose: Secondary | ICD-10-CM | POA: Diagnosis not present

## 2016-10-15 DIAGNOSIS — Z1211 Encounter for screening for malignant neoplasm of colon: Secondary | ICD-10-CM | POA: Diagnosis not present

## 2016-10-15 DIAGNOSIS — E7800 Pure hypercholesterolemia, unspecified: Secondary | ICD-10-CM | POA: Diagnosis not present

## 2016-10-29 ENCOUNTER — Other Ambulatory Visit: Payer: Self-pay | Admitting: Internal Medicine

## 2016-10-29 DIAGNOSIS — Z1231 Encounter for screening mammogram for malignant neoplasm of breast: Secondary | ICD-10-CM

## 2016-11-04 IMAGING — MG MM DIGITAL SCREENING BILAT W/ CAD
4 series · 4 of 4 positions shown · non-contrast
Comparison: None.

CLINICAL DATA: Screening.

EXAM:
DIGITAL SCREENING BILATERAL MAMMOGRAM WITH CAD

[R MLO]
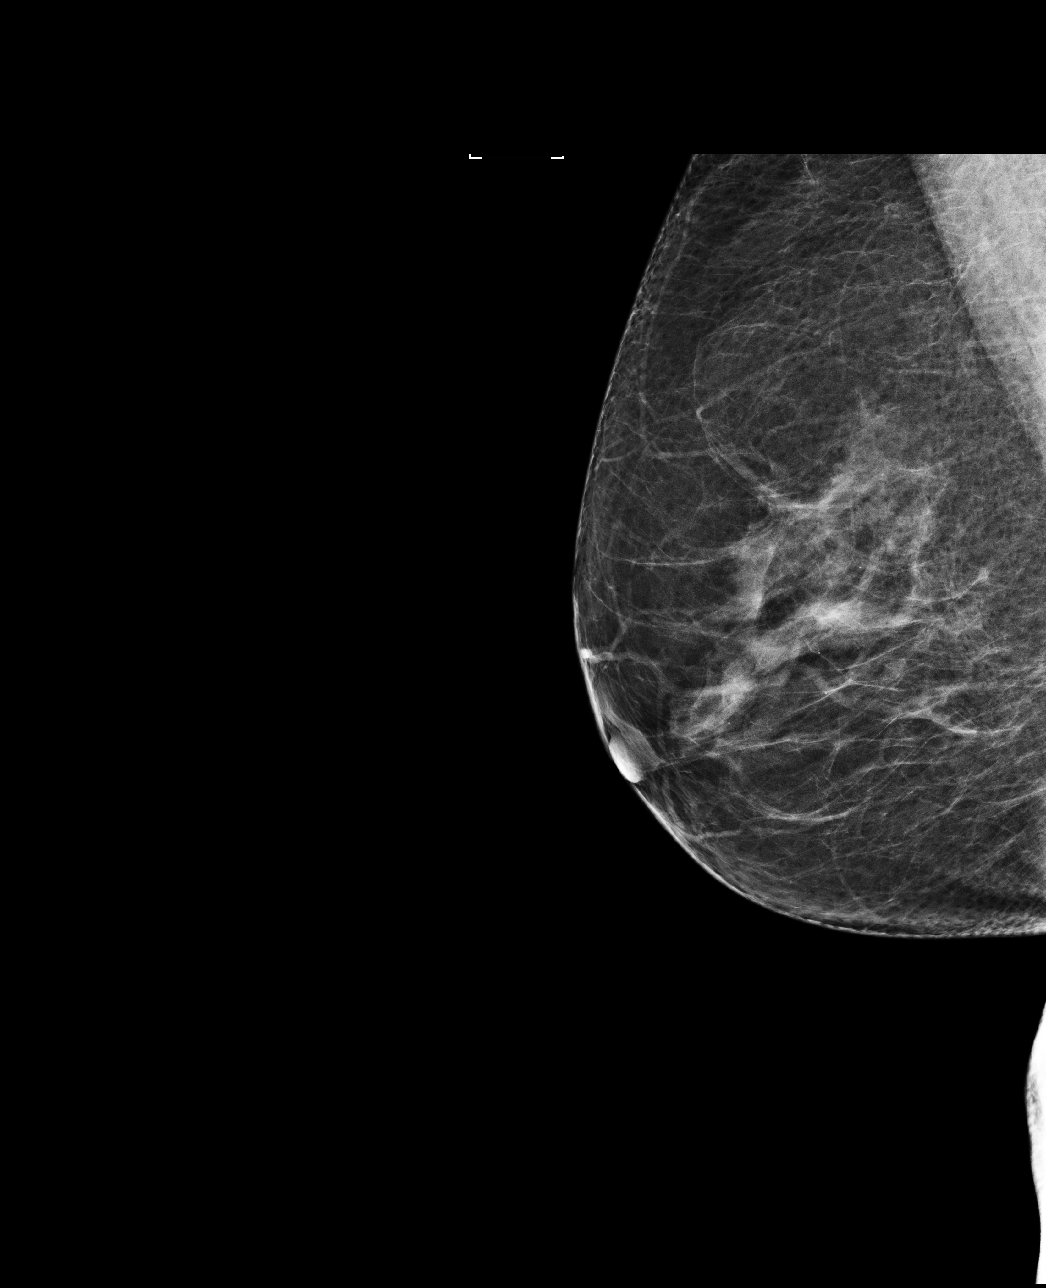

[L MLO]
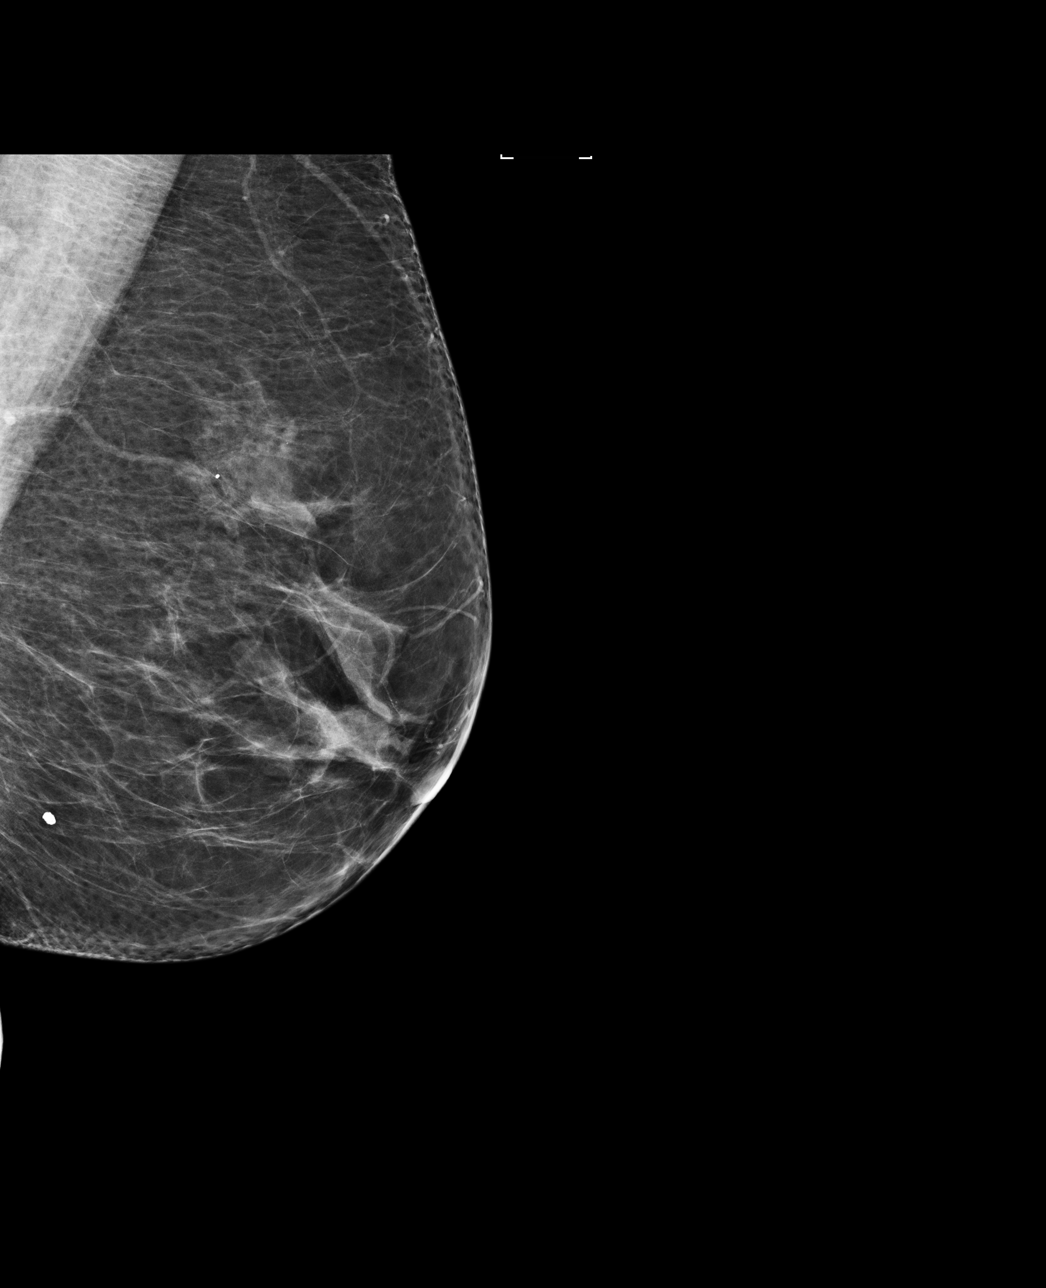

[L CC]
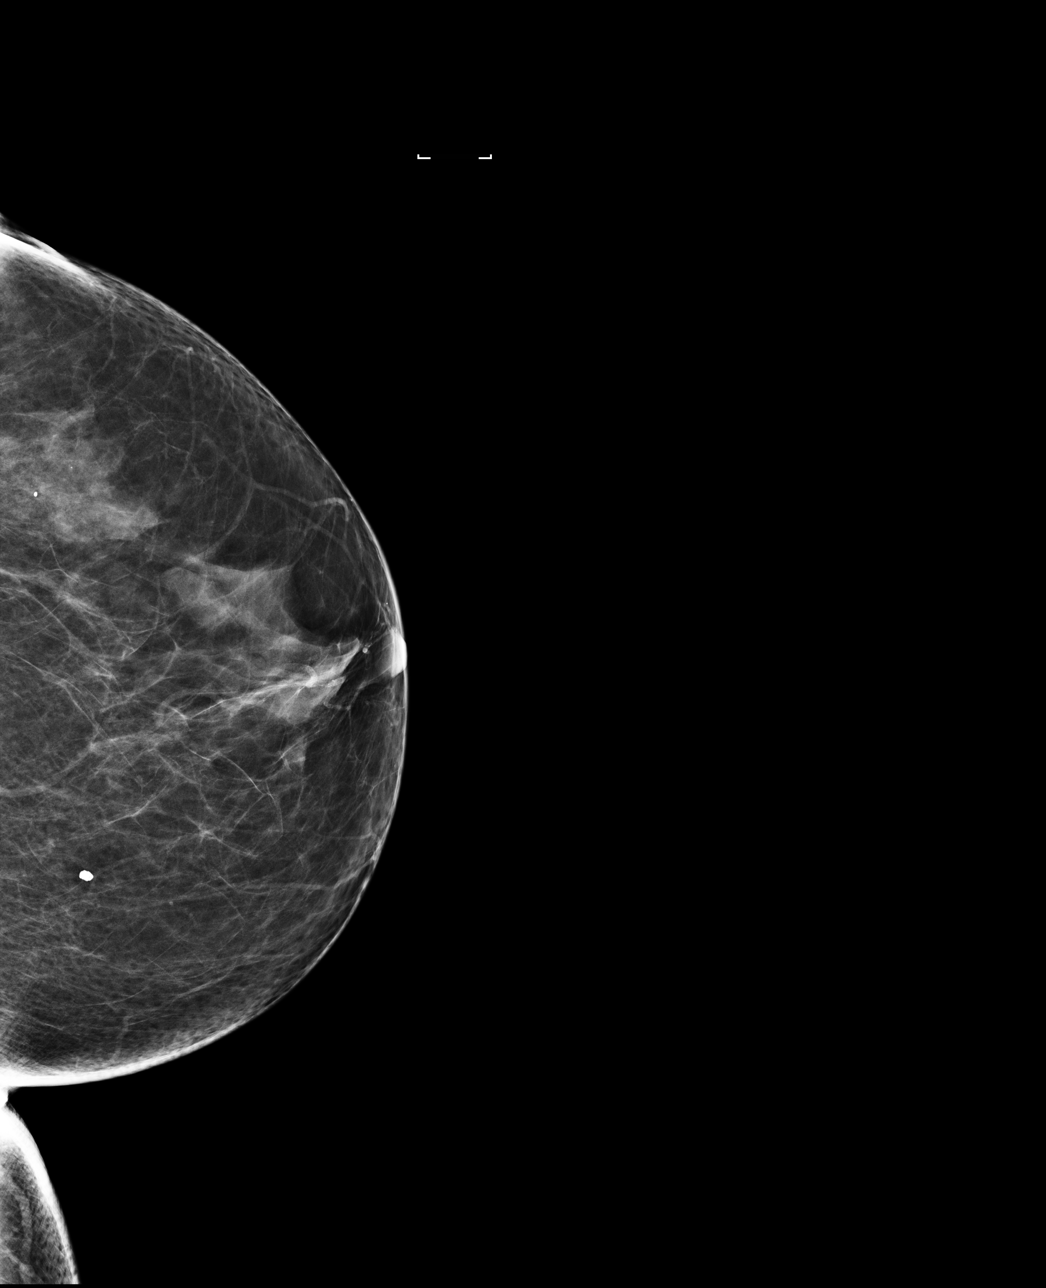

[R CC]
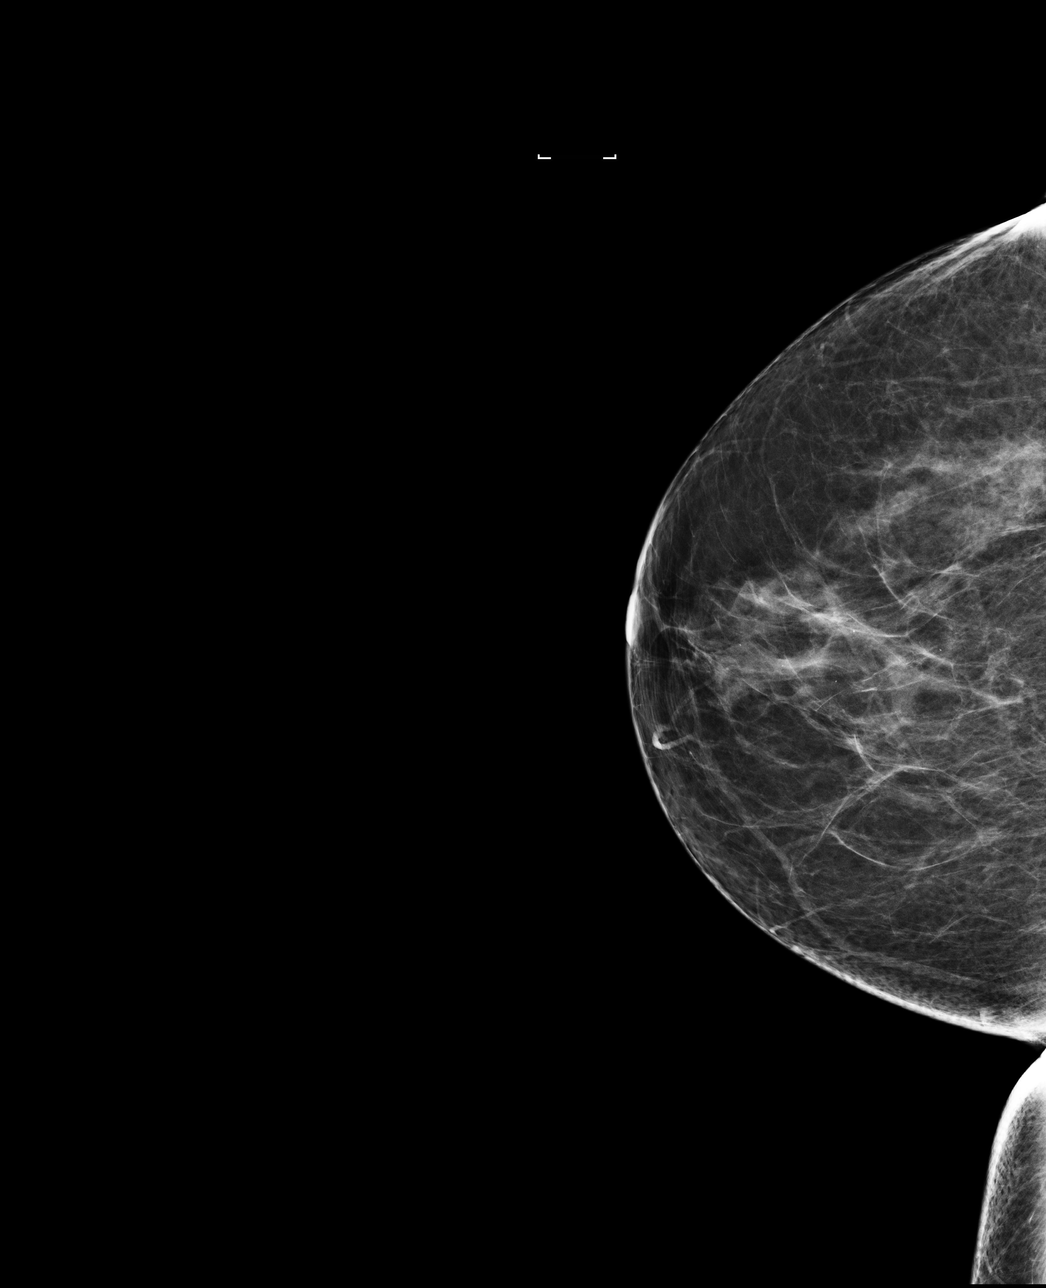

[4 of 4 positions shown; findings below may reference images not displayed]

ACR Breast Density Category b: There are scattered areas of
fibroglandular density.
FINDINGS: There are no findings suspicious for malignancy. Images were
processed with CAD.
IMPRESSION: No mammographic evidence of malignancy. A result letter of this
screening mammogram will be mailed directly to the patient.

RECOMMENDATION:
Screening mammogram in one year. (Code:[id number])

BI-RADS CATEGORY  1: Negative.

## 2016-11-27 ENCOUNTER — Ambulatory Visit
Admission: RE | Admit: 2016-11-27 | Discharge: 2016-11-27 | Disposition: A | Discharge status: Home / Self Care | Payer: PPO | Source: Ambulatory Visit | Attending: Internal Medicine | Admitting: Internal Medicine

## 2016-11-27 DIAGNOSIS — Z1231 Encounter for screening mammogram for malignant neoplasm of breast: Secondary | ICD-10-CM | POA: Diagnosis not present

## 2017-04-21 DIAGNOSIS — Z79899 Other long term (current) drug therapy: Secondary | ICD-10-CM | POA: Diagnosis not present

## 2017-04-21 DIAGNOSIS — N390 Urinary tract infection, site not specified: Secondary | ICD-10-CM | POA: Diagnosis not present

## 2017-04-21 DIAGNOSIS — Z0000 Encounter for general adult medical examination without abnormal findings: Secondary | ICD-10-CM | POA: Diagnosis not present

## 2017-04-21 DIAGNOSIS — R7309 Other abnormal glucose: Secondary | ICD-10-CM | POA: Diagnosis not present

## 2017-04-21 DIAGNOSIS — E7800 Pure hypercholesterolemia, unspecified: Secondary | ICD-10-CM | POA: Diagnosis not present

## 2017-04-21 DIAGNOSIS — I10 Essential (primary) hypertension: Secondary | ICD-10-CM | POA: Diagnosis not present

## 2017-10-15 DIAGNOSIS — R195 Other fecal abnormalities: Secondary | ICD-10-CM | POA: Diagnosis not present

## 2017-10-21 DIAGNOSIS — Z0000 Encounter for general adult medical examination without abnormal findings: Secondary | ICD-10-CM | POA: Diagnosis not present

## 2017-10-21 DIAGNOSIS — R7309 Other abnormal glucose: Secondary | ICD-10-CM | POA: Diagnosis not present

## 2017-10-21 DIAGNOSIS — Z79899 Other long term (current) drug therapy: Secondary | ICD-10-CM | POA: Diagnosis not present

## 2017-10-21 DIAGNOSIS — I10 Essential (primary) hypertension: Secondary | ICD-10-CM | POA: Diagnosis not present

## 2017-10-21 DIAGNOSIS — Z1231 Encounter for screening mammogram for malignant neoplasm of breast: Secondary | ICD-10-CM | POA: Diagnosis not present

## 2017-10-21 DIAGNOSIS — E7800 Pure hypercholesterolemia, unspecified: Secondary | ICD-10-CM | POA: Diagnosis not present

## 2017-12-16 ENCOUNTER — Other Ambulatory Visit: Payer: Self-pay | Admitting: Internal Medicine

## 2017-12-16 DIAGNOSIS — Z1231 Encounter for screening mammogram for malignant neoplasm of breast: Secondary | ICD-10-CM

## 2018-01-05 ENCOUNTER — Ambulatory Visit
Admission: RE | Admit: 2018-01-05 | Discharge: 2018-01-05 | Disposition: A | Discharge status: Home / Self Care | Payer: PPO | Source: Ambulatory Visit | Attending: Internal Medicine | Admitting: Internal Medicine

## 2018-01-05 DIAGNOSIS — Z1231 Encounter for screening mammogram for malignant neoplasm of breast: Secondary | ICD-10-CM | POA: Insufficient documentation

## 2018-04-02 DIAGNOSIS — I10 Essential (primary) hypertension: Secondary | ICD-10-CM | POA: Diagnosis not present

## 2018-04-02 DIAGNOSIS — E7800 Pure hypercholesterolemia, unspecified: Secondary | ICD-10-CM | POA: Diagnosis not present

## 2018-04-02 DIAGNOSIS — Z79899 Other long term (current) drug therapy: Secondary | ICD-10-CM | POA: Diagnosis not present

## 2018-04-02 DIAGNOSIS — R7309 Other abnormal glucose: Secondary | ICD-10-CM | POA: Diagnosis not present

## 2018-04-02 DIAGNOSIS — Z0000 Encounter for general adult medical examination without abnormal findings: Secondary | ICD-10-CM | POA: Diagnosis not present

## 2018-10-22 DIAGNOSIS — Z1211 Encounter for screening for malignant neoplasm of colon: Secondary | ICD-10-CM | POA: Diagnosis not present

## 2018-10-22 DIAGNOSIS — Z79899 Other long term (current) drug therapy: Secondary | ICD-10-CM | POA: Diagnosis not present

## 2018-10-22 DIAGNOSIS — R7309 Other abnormal glucose: Secondary | ICD-10-CM | POA: Diagnosis not present

## 2018-10-22 DIAGNOSIS — Z1239 Encounter for other screening for malignant neoplasm of breast: Secondary | ICD-10-CM | POA: Diagnosis not present

## 2018-10-22 DIAGNOSIS — Z13820 Encounter for screening for osteoporosis: Secondary | ICD-10-CM | POA: Diagnosis not present

## 2018-10-22 DIAGNOSIS — Z0000 Encounter for general adult medical examination without abnormal findings: Secondary | ICD-10-CM | POA: Diagnosis not present

## 2018-10-22 DIAGNOSIS — I10 Essential (primary) hypertension: Secondary | ICD-10-CM | POA: Diagnosis not present

## 2018-10-22 DIAGNOSIS — E7800 Pure hypercholesterolemia, unspecified: Secondary | ICD-10-CM | POA: Diagnosis not present

## 2018-10-29 DIAGNOSIS — M8588 Other specified disorders of bone density and structure, other site: Secondary | ICD-10-CM | POA: Diagnosis not present

## 2018-11-09 DIAGNOSIS — Z1211 Encounter for screening for malignant neoplasm of colon: Secondary | ICD-10-CM | POA: Diagnosis not present

## 2018-11-18 ENCOUNTER — Other Ambulatory Visit: Payer: Self-pay | Admitting: Internal Medicine

## 2018-11-18 DIAGNOSIS — Z1231 Encounter for screening mammogram for malignant neoplasm of breast: Secondary | ICD-10-CM

## 2018-12-04 DIAGNOSIS — E7800 Pure hypercholesterolemia, unspecified: Secondary | ICD-10-CM | POA: Diagnosis not present

## 2018-12-04 DIAGNOSIS — R7309 Other abnormal glucose: Secondary | ICD-10-CM | POA: Diagnosis not present

## 2018-12-04 DIAGNOSIS — Z79899 Other long term (current) drug therapy: Secondary | ICD-10-CM | POA: Diagnosis not present

## 2018-12-04 DIAGNOSIS — I10 Essential (primary) hypertension: Secondary | ICD-10-CM | POA: Diagnosis not present

## 2019-03-04 ENCOUNTER — Other Ambulatory Visit: Payer: Self-pay

## 2019-03-04 ENCOUNTER — Ambulatory Visit
Admission: RE | Admit: 2019-03-04 | Discharge: 2019-03-04 | Disposition: A | Discharge status: Home / Self Care | Payer: PPO | Source: Ambulatory Visit | Attending: Internal Medicine | Admitting: Internal Medicine

## 2019-03-04 DIAGNOSIS — Z1231 Encounter for screening mammogram for malignant neoplasm of breast: Secondary | ICD-10-CM | POA: Insufficient documentation

## 2019-04-27 DIAGNOSIS — E7800 Pure hypercholesterolemia, unspecified: Secondary | ICD-10-CM | POA: Diagnosis not present

## 2019-04-27 DIAGNOSIS — R7309 Other abnormal glucose: Secondary | ICD-10-CM | POA: Diagnosis not present

## 2019-04-27 DIAGNOSIS — I10 Essential (primary) hypertension: Secondary | ICD-10-CM | POA: Diagnosis not present

## 2019-04-27 DIAGNOSIS — Z79899 Other long term (current) drug therapy: Secondary | ICD-10-CM | POA: Diagnosis not present

## 2019-05-04 DIAGNOSIS — M25532 Pain in left wrist: Secondary | ICD-10-CM | POA: Diagnosis not present

## 2019-05-04 DIAGNOSIS — R7309 Other abnormal glucose: Secondary | ICD-10-CM | POA: Diagnosis not present

## 2019-05-04 DIAGNOSIS — Z79899 Other long term (current) drug therapy: Secondary | ICD-10-CM | POA: Diagnosis not present

## 2019-05-04 DIAGNOSIS — I10 Essential (primary) hypertension: Secondary | ICD-10-CM | POA: Diagnosis not present

## 2019-05-04 DIAGNOSIS — Z0000 Encounter for general adult medical examination without abnormal findings: Secondary | ICD-10-CM | POA: Diagnosis not present

## 2019-05-04 DIAGNOSIS — E7800 Pure hypercholesterolemia, unspecified: Secondary | ICD-10-CM | POA: Diagnosis not present

## 2019-10-28 DIAGNOSIS — Z79899 Other long term (current) drug therapy: Secondary | ICD-10-CM | POA: Diagnosis not present

## 2019-10-28 DIAGNOSIS — I10 Essential (primary) hypertension: Secondary | ICD-10-CM | POA: Diagnosis not present

## 2019-10-28 DIAGNOSIS — E7800 Pure hypercholesterolemia, unspecified: Secondary | ICD-10-CM | POA: Diagnosis not present

## 2019-10-28 DIAGNOSIS — R7309 Other abnormal glucose: Secondary | ICD-10-CM | POA: Diagnosis not present

## 2019-11-04 ENCOUNTER — Other Ambulatory Visit: Payer: Self-pay | Admitting: Internal Medicine

## 2019-11-04 DIAGNOSIS — E7800 Pure hypercholesterolemia, unspecified: Secondary | ICD-10-CM | POA: Diagnosis not present

## 2019-11-04 DIAGNOSIS — Z1231 Encounter for screening mammogram for malignant neoplasm of breast: Secondary | ICD-10-CM | POA: Diagnosis not present

## 2019-11-04 DIAGNOSIS — Z1211 Encounter for screening for malignant neoplasm of colon: Secondary | ICD-10-CM | POA: Diagnosis not present

## 2019-11-04 DIAGNOSIS — Z0000 Encounter for general adult medical examination without abnormal findings: Secondary | ICD-10-CM | POA: Diagnosis not present

## 2019-11-04 DIAGNOSIS — I10 Essential (primary) hypertension: Secondary | ICD-10-CM | POA: Diagnosis not present

## 2019-11-04 DIAGNOSIS — Z79899 Other long term (current) drug therapy: Secondary | ICD-10-CM | POA: Diagnosis not present

## 2019-11-04 DIAGNOSIS — R7309 Other abnormal glucose: Secondary | ICD-10-CM | POA: Diagnosis not present

## 2019-11-14 ENCOUNTER — Ambulatory Visit: Payer: PPO | Attending: Internal Medicine

## 2019-11-14 DIAGNOSIS — Z23 Encounter for immunization: Secondary | ICD-10-CM | POA: Insufficient documentation

## 2019-11-14 NOTE — Progress Notes (Signed)
   Covid-19 Vaccination Clinic  Name:  Samantha Baldwin    MRN: [id number] DOB: 1951-04-29  11/14/2019  [name] was observed post Covid-19 immunization for 15 minutes without incidence. She was provided with Vaccine Information Sheet and instruction to access the V-Safe system.   [name] was instructed to call 911 with any severe reactions post vaccine: Marland Kitchen Difficulty breathing  . Swelling of your face and throat  . A fast heartbeat  . A bad rash all over your body  . Dizziness and weakness    Immunizations Administered    Name Date Dose VIS Date Route   Pfizer COVID-19 Vaccine 11/14/2019 11:34 AM 0.3 mL 09/17/2019 Intramuscular   Manufacturer: [employer], [employer]   Lot: [id number]   NDC: [address]-0

## 2019-12-08 ENCOUNTER — Ambulatory Visit: Payer: PPO | Attending: Internal Medicine

## 2019-12-08 DIAGNOSIS — Z23 Encounter for immunization: Secondary | ICD-10-CM

## 2019-12-08 NOTE — Progress Notes (Signed)
   Covid-19 Vaccination Clinic  Name:  Samantha Baldwin    MRN: [id number] DOB: 18-Dec-1950  12/08/2019  [name] was observed post Covid-19 immunization for 15 minutes without incident. She was provided with Vaccine Information Sheet and instruction to access the V-Safe system.   [name] was instructed to call 911 with any severe reactions post vaccine: Marland Kitchen Difficulty breathing  . Swelling of face and throat  . A fast heartbeat  . A bad rash all over body  . Dizziness and weakness   Immunizations Administered    Name Date Dose VIS Date Route   Pfizer COVID-19 Vaccine 12/08/2019  8:42 AM 0.3 mL 09/17/2019 Intramuscular   Manufacturer: [employer], [employer]   Lot: [id number]   NDC: [address]-3

## 2020-04-04 ENCOUNTER — Ambulatory Visit
Admission: RE | Admit: 2020-04-04 | Discharge: 2020-04-04 | Disposition: A | Discharge status: Home / Self Care | Payer: PPO | Source: Ambulatory Visit | Attending: Internal Medicine | Admitting: Internal Medicine

## 2020-04-04 DIAGNOSIS — Z1231 Encounter for screening mammogram for malignant neoplasm of breast: Secondary | ICD-10-CM | POA: Insufficient documentation

## 2020-04-26 DIAGNOSIS — R7309 Other abnormal glucose: Secondary | ICD-10-CM | POA: Diagnosis not present

## 2020-04-26 DIAGNOSIS — E7800 Pure hypercholesterolemia, unspecified: Secondary | ICD-10-CM | POA: Diagnosis not present

## 2020-04-26 DIAGNOSIS — Z79899 Other long term (current) drug therapy: Secondary | ICD-10-CM | POA: Diagnosis not present

## 2020-04-26 DIAGNOSIS — I10 Essential (primary) hypertension: Secondary | ICD-10-CM | POA: Diagnosis not present

## 2020-05-08 DIAGNOSIS — I10 Essential (primary) hypertension: Secondary | ICD-10-CM | POA: Diagnosis not present

## 2020-05-08 DIAGNOSIS — Z79899 Other long term (current) drug therapy: Secondary | ICD-10-CM | POA: Diagnosis not present

## 2020-05-08 DIAGNOSIS — Z0000 Encounter for general adult medical examination without abnormal findings: Secondary | ICD-10-CM | POA: Diagnosis not present

## 2020-05-08 DIAGNOSIS — R7309 Other abnormal glucose: Secondary | ICD-10-CM | POA: Diagnosis not present

## 2020-05-08 DIAGNOSIS — E7800 Pure hypercholesterolemia, unspecified: Secondary | ICD-10-CM | POA: Diagnosis not present

## 2021-03-08 ENCOUNTER — Other Ambulatory Visit: Payer: Self-pay | Admitting: Internal Medicine

## 2021-04-11 DIAGNOSIS — E7800 Pure hypercholesterolemia, unspecified: Secondary | ICD-10-CM | POA: Diagnosis not present

## 2021-04-11 DIAGNOSIS — R7309 Other abnormal glucose: Secondary | ICD-10-CM | POA: Diagnosis not present

## 2021-04-11 DIAGNOSIS — Z79899 Other long term (current) drug therapy: Secondary | ICD-10-CM | POA: Diagnosis not present

## 2021-04-18 DIAGNOSIS — R7309 Other abnormal glucose: Secondary | ICD-10-CM | POA: Diagnosis not present

## 2021-04-18 DIAGNOSIS — I10 Essential (primary) hypertension: Secondary | ICD-10-CM | POA: Diagnosis not present

## 2021-04-18 DIAGNOSIS — Z1231 Encounter for screening mammogram for malignant neoplasm of breast: Secondary | ICD-10-CM | POA: Diagnosis not present

## 2021-04-18 DIAGNOSIS — E7800 Pure hypercholesterolemia, unspecified: Secondary | ICD-10-CM | POA: Diagnosis not present

## 2021-04-18 DIAGNOSIS — Z79899 Other long term (current) drug therapy: Secondary | ICD-10-CM | POA: Diagnosis not present

## 2021-04-18 DIAGNOSIS — Z0000 Encounter for general adult medical examination without abnormal findings: Secondary | ICD-10-CM | POA: Diagnosis not present

## 2021-04-18 DIAGNOSIS — Z1211 Encounter for screening for malignant neoplasm of colon: Secondary | ICD-10-CM | POA: Diagnosis not present

## 2021-04-23 ENCOUNTER — Other Ambulatory Visit: Payer: Self-pay | Admitting: Internal Medicine

## 2021-04-23 DIAGNOSIS — Z1231 Encounter for screening mammogram for malignant neoplasm of breast: Secondary | ICD-10-CM

## 2021-05-01 ENCOUNTER — Other Ambulatory Visit: Payer: Self-pay

## 2021-05-01 ENCOUNTER — Ambulatory Visit
Admission: RE | Admit: 2021-05-01 | Discharge: 2021-05-01 | Disposition: A | Discharge status: Home / Self Care | Payer: PPO | Source: Ambulatory Visit | Attending: Internal Medicine | Admitting: Internal Medicine

## 2021-05-01 DIAGNOSIS — Z1231 Encounter for screening mammogram for malignant neoplasm of breast: Secondary | ICD-10-CM | POA: Diagnosis not present

## 2021-07-11 DIAGNOSIS — E7800 Pure hypercholesterolemia, unspecified: Secondary | ICD-10-CM | POA: Diagnosis not present

## 2021-07-11 DIAGNOSIS — R7309 Other abnormal glucose: Secondary | ICD-10-CM | POA: Diagnosis not present

## 2021-07-11 DIAGNOSIS — Z79899 Other long term (current) drug therapy: Secondary | ICD-10-CM | POA: Diagnosis not present

## 2021-07-18 DIAGNOSIS — E7800 Pure hypercholesterolemia, unspecified: Secondary | ICD-10-CM | POA: Diagnosis not present

## 2021-07-18 DIAGNOSIS — R7309 Other abnormal glucose: Secondary | ICD-10-CM | POA: Diagnosis not present

## 2021-07-18 DIAGNOSIS — I10 Essential (primary) hypertension: Secondary | ICD-10-CM | POA: Diagnosis not present

## 2021-07-18 DIAGNOSIS — Z79899 Other long term (current) drug therapy: Secondary | ICD-10-CM | POA: Diagnosis not present

## 2021-09-05 DIAGNOSIS — L089 Local infection of the skin and subcutaneous tissue, unspecified: Secondary | ICD-10-CM | POA: Diagnosis not present

## 2021-09-05 DIAGNOSIS — L570 Actinic keratosis: Secondary | ICD-10-CM | POA: Diagnosis not present

## 2021-10-02 DIAGNOSIS — H6123 Impacted cerumen, bilateral: Secondary | ICD-10-CM | POA: Diagnosis not present

## 2022-01-09 DIAGNOSIS — E7800 Pure hypercholesterolemia, unspecified: Secondary | ICD-10-CM | POA: Diagnosis not present

## 2022-01-09 DIAGNOSIS — Z79899 Other long term (current) drug therapy: Secondary | ICD-10-CM | POA: Diagnosis not present

## 2022-01-09 DIAGNOSIS — R7309 Other abnormal glucose: Secondary | ICD-10-CM | POA: Diagnosis not present

## 2022-01-09 DIAGNOSIS — I10 Essential (primary) hypertension: Secondary | ICD-10-CM | POA: Diagnosis not present

## 2022-01-15 DIAGNOSIS — Z1211 Encounter for screening for malignant neoplasm of colon: Secondary | ICD-10-CM | POA: Diagnosis not present

## 2022-01-16 DIAGNOSIS — R7309 Other abnormal glucose: Secondary | ICD-10-CM | POA: Diagnosis not present

## 2022-01-16 DIAGNOSIS — Z1231 Encounter for screening mammogram for malignant neoplasm of breast: Secondary | ICD-10-CM | POA: Diagnosis not present

## 2022-01-16 DIAGNOSIS — E7800 Pure hypercholesterolemia, unspecified: Secondary | ICD-10-CM | POA: Diagnosis not present

## 2022-01-16 DIAGNOSIS — I10 Essential (primary) hypertension: Secondary | ICD-10-CM | POA: Diagnosis not present

## 2022-01-16 DIAGNOSIS — Z79899 Other long term (current) drug therapy: Secondary | ICD-10-CM | POA: Diagnosis not present

## 2022-01-17 ENCOUNTER — Other Ambulatory Visit: Payer: Self-pay | Admitting: Internal Medicine

## 2022-01-17 DIAGNOSIS — Z1231 Encounter for screening mammogram for malignant neoplasm of breast: Secondary | ICD-10-CM

## 2022-02-07 ENCOUNTER — Other Ambulatory Visit: Payer: Self-pay | Admitting: Internal Medicine

## 2022-02-07 DIAGNOSIS — Z1231 Encounter for screening mammogram for malignant neoplasm of breast: Secondary | ICD-10-CM

## 2022-05-02 ENCOUNTER — Ambulatory Visit
Admission: RE | Admit: 2022-05-02 | Discharge: 2022-05-02 | Disposition: A | Discharge status: Home / Self Care | Payer: PPO | Source: Ambulatory Visit | Attending: Internal Medicine | Admitting: Internal Medicine

## 2022-05-02 DIAGNOSIS — Z1231 Encounter for screening mammogram for malignant neoplasm of breast: Secondary | ICD-10-CM | POA: Insufficient documentation

## 2022-07-12 DIAGNOSIS — Z79899 Other long term (current) drug therapy: Secondary | ICD-10-CM | POA: Diagnosis not present

## 2022-07-12 DIAGNOSIS — R7309 Other abnormal glucose: Secondary | ICD-10-CM | POA: Diagnosis not present

## 2022-07-12 DIAGNOSIS — I10 Essential (primary) hypertension: Secondary | ICD-10-CM | POA: Diagnosis not present

## 2022-07-12 DIAGNOSIS — E7800 Pure hypercholesterolemia, unspecified: Secondary | ICD-10-CM | POA: Diagnosis not present

## 2022-07-19 DIAGNOSIS — I10 Essential (primary) hypertension: Secondary | ICD-10-CM | POA: Diagnosis not present

## 2022-07-19 DIAGNOSIS — Z0000 Encounter for general adult medical examination without abnormal findings: Secondary | ICD-10-CM | POA: Diagnosis not present

## 2022-07-19 DIAGNOSIS — E7800 Pure hypercholesterolemia, unspecified: Secondary | ICD-10-CM | POA: Diagnosis not present

## 2022-07-19 DIAGNOSIS — R7309 Other abnormal glucose: Secondary | ICD-10-CM | POA: Diagnosis not present

## 2022-07-19 DIAGNOSIS — Z79899 Other long term (current) drug therapy: Secondary | ICD-10-CM | POA: Diagnosis not present

## 2022-09-18 DIAGNOSIS — H2513 Age-related nuclear cataract, bilateral: Secondary | ICD-10-CM | POA: Diagnosis not present

## 2022-09-18 DIAGNOSIS — H35363 Drusen (degenerative) of macula, bilateral: Secondary | ICD-10-CM | POA: Diagnosis not present

## 2022-12-30 ENCOUNTER — Encounter: Payer: Self-pay | Admitting: Podiatry

## 2022-12-30 ENCOUNTER — Ambulatory Visit: Payer: Medicare Other | Admitting: Podiatry

## 2022-12-30 ENCOUNTER — Ambulatory Visit (INDEPENDENT_AMBULATORY_CARE_PROVIDER_SITE_OTHER): Payer: Medicare Other

## 2022-12-30 VITALS — BP 142/79 | HR 64

## 2022-12-30 DIAGNOSIS — L84 Corns and callosities: Secondary | ICD-10-CM

## 2022-12-30 DIAGNOSIS — M2041 Other hammer toe(s) (acquired), right foot: Secondary | ICD-10-CM | POA: Diagnosis not present

## 2022-12-30 NOTE — Patient Instructions (Signed)
More silicone pads can be purchased from:  [email or web address]  

## 2022-12-30 NOTE — Progress Notes (Signed)
  Subjective:  Patient ID: DANARIA FREI, female    DOB: [birth year],  MRN: [id number]  Chief Complaint  Patient presents with   Foot Pain    "My foot started hurting about three weeks ago." N - foot pain L - interdigital 1,2 right D - 4 weeks O - suddenly, gotten better C - sharp pain A - walking, certain socks T - tried different shoes    72 y.o. female presents with the above complaint. History confirmed with patient.  She says it is already getting better, the toes are rubbing together on the first and second toes of the right foot and causing a painful sore.  Was very red and inflamed.  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    Radiographs: Multiple views x-ray of the right foot: no fracture, dislocation, swelling or degenerative changes noted Assessment:   1. Hammer toe of right foot   2. Callus of foot      Plan:  Patient was evaluated and treated and all questions answered.  We discussed the hyperkeratotic lesion and the tenderness that develops from rubbing of the phalanges of both toes with digital contractures and operative and nonoperative treatment.  This is already resolving we discussed appropriate shoes and offloading with silicone pads which she will utilize and return to see me as needed if it worsens or does not improve.  Return if symptoms worsen or fail to improve.

## 2023-01-20 ENCOUNTER — Other Ambulatory Visit: Payer: Self-pay | Admitting: Internal Medicine

## 2023-01-20 DIAGNOSIS — Z1231 Encounter for screening mammogram for malignant neoplasm of breast: Secondary | ICD-10-CM

## 2023-05-05 ENCOUNTER — Ambulatory Visit
Admission: RE | Admit: 2023-05-05 | Discharge: 2023-05-05 | Disposition: A | Discharge status: Home / Self Care | Payer: Medicare Other | Source: Ambulatory Visit | Attending: Internal Medicine | Admitting: Internal Medicine

## 2023-05-05 DIAGNOSIS — Z1231 Encounter for screening mammogram for malignant neoplasm of breast: Secondary | ICD-10-CM | POA: Diagnosis present

## 2024-02-12 ENCOUNTER — Other Ambulatory Visit: Payer: Self-pay | Admitting: Internal Medicine

## 2024-02-12 DIAGNOSIS — Z1231 Encounter for screening mammogram for malignant neoplasm of breast: Secondary | ICD-10-CM

## 2024-07-02 ENCOUNTER — Ambulatory Visit
Admission: RE | Admit: 2024-07-02 | Discharge: 2024-07-02 | Disposition: A | Discharge status: Home / Self Care | Source: Ambulatory Visit | Attending: Internal Medicine | Admitting: Internal Medicine

## 2024-07-02 DIAGNOSIS — Z1231 Encounter for screening mammogram for malignant neoplasm of breast: Secondary | ICD-10-CM | POA: Diagnosis present
